# Patient Record
Sex: Female | Born: 1965 | Race: White | Hispanic: No | Marital: Married | Smoking: Never smoker
Health system: Southern US, Community
[De-identification: ages and names within clinical notes are randomized; demographics above are authoritative.]

## PROBLEM LIST (undated history)

## (undated) DIAGNOSIS — J302 Other seasonal allergic rhinitis: Secondary | ICD-10-CM

## (undated) DIAGNOSIS — E785 Hyperlipidemia, unspecified: Secondary | ICD-10-CM

## (undated) DIAGNOSIS — D229 Melanocytic nevi, unspecified: Secondary | ICD-10-CM

## (undated) DIAGNOSIS — Z8619 Personal history of other infectious and parasitic diseases: Secondary | ICD-10-CM

## (undated) DIAGNOSIS — F419 Anxiety disorder, unspecified: Secondary | ICD-10-CM

## (undated) DIAGNOSIS — F32A Depression, unspecified: Secondary | ICD-10-CM

## (undated) DIAGNOSIS — K219 Gastro-esophageal reflux disease without esophagitis: Secondary | ICD-10-CM

## (undated) DIAGNOSIS — L900 Lichen sclerosus et atrophicus: Secondary | ICD-10-CM

## (undated) DIAGNOSIS — N952 Postmenopausal atrophic vaginitis: Secondary | ICD-10-CM

## (undated) DIAGNOSIS — K529 Noninfective gastroenteritis and colitis, unspecified: Secondary | ICD-10-CM

## (undated) HISTORY — PX: ENDOMETRIAL ABLATION: SHX621

## (undated) HISTORY — DX: Depression, unspecified: F32.A

## (undated) HISTORY — PX: TONSILLECTOMY: SHX5217

## (undated) HISTORY — PX: EYE SURGERY: SHX253

## (undated) HISTORY — PX: ABDOMINAL HYSTERECTOMY: SHX81

## (undated) HISTORY — PX: OOPHORECTOMY: SHX86

## (undated) HISTORY — DX: Melanocytic nevi, unspecified: D22.9

## (undated) HISTORY — PX: OTHER SURGICAL HISTORY: SHX169

## (undated) HISTORY — PX: CHOLECYSTECTOMY: SHX55

## (undated) HISTORY — DX: Gastro-esophageal reflux disease without esophagitis: K21.9

## (undated) HISTORY — DX: Postmenopausal atrophic vaginitis: N95.2

## (undated) HISTORY — DX: Anxiety disorder, unspecified: F41.9

## (undated) HISTORY — DX: Lichen sclerosus et atrophicus: L90.0

## (undated) HISTORY — DX: Personal history of other infectious and parasitic diseases: Z86.19

## (undated) HISTORY — DX: Other seasonal allergic rhinitis: J30.2

## (undated) HISTORY — DX: Noninfective gastroenteritis and colitis, unspecified: K52.9

## (undated) HISTORY — DX: Hyperlipidemia, unspecified: E78.5

---

## 1998-08-26 ENCOUNTER — Other Ambulatory Visit: Admission: RE | Admit: 1998-08-26 | Discharge: 1998-08-26 | Payer: Self-pay | Admitting: *Deleted

## 2002-01-23 ENCOUNTER — Other Ambulatory Visit: Admission: RE | Admit: 2002-01-23 | Discharge: 2002-01-23 | Payer: Self-pay | Admitting: *Deleted

## 2002-01-24 ENCOUNTER — Encounter: Admission: RE | Admit: 2002-01-24 | Discharge: 2002-01-24 | Payer: Self-pay | Admitting: *Deleted

## 2002-05-22 ENCOUNTER — Encounter: Admission: RE | Admit: 2002-05-22 | Discharge: 2002-05-22 | Payer: Self-pay | Admitting: *Deleted

## 2002-12-24 ENCOUNTER — Encounter: Admission: RE | Admit: 2002-12-24 | Discharge: 2002-12-24 | Payer: Self-pay | Admitting: Family Medicine

## 2002-12-24 ENCOUNTER — Encounter: Payer: Self-pay | Admitting: Family Medicine

## 2003-06-11 ENCOUNTER — Other Ambulatory Visit: Admission: RE | Admit: 2003-06-11 | Discharge: 2003-06-11 | Payer: Self-pay | Admitting: *Deleted

## 2003-06-13 ENCOUNTER — Encounter: Admission: RE | Admit: 2003-06-13 | Discharge: 2003-06-13 | Payer: Self-pay | Admitting: *Deleted

## 2005-09-14 ENCOUNTER — Ambulatory Visit (HOSPITAL_COMMUNITY): Admission: RE | Admit: 2005-09-14 | Discharge: 2005-09-14 | Payer: Self-pay | Admitting: Obstetrics and Gynecology

## 2006-12-28 ENCOUNTER — Ambulatory Visit (HOSPITAL_COMMUNITY): Admission: RE | Admit: 2006-12-28 | Discharge: 2006-12-28 | Payer: Self-pay | Admitting: Family Medicine

## 2007-04-18 ENCOUNTER — Encounter: Admission: RE | Admit: 2007-04-18 | Discharge: 2007-04-18 | Payer: Self-pay | Admitting: Family Medicine

## 2007-08-29 ENCOUNTER — Ambulatory Visit: Payer: Self-pay

## 2007-10-03 ENCOUNTER — Ambulatory Visit: Payer: Self-pay

## 2007-10-24 ENCOUNTER — Ambulatory Visit: Payer: Self-pay

## 2008-06-02 ENCOUNTER — Emergency Department: Payer: Self-pay | Admitting: Emergency Medicine

## 2009-01-22 ENCOUNTER — Emergency Department: Payer: Self-pay | Admitting: Emergency Medicine

## 2009-01-30 ENCOUNTER — Encounter: Admission: RE | Admit: 2009-01-30 | Discharge: 2009-01-30 | Payer: Self-pay | Admitting: Family Medicine

## 2009-11-11 ENCOUNTER — Ambulatory Visit: Payer: Self-pay | Admitting: Unknown Physician Specialty

## 2009-11-12 ENCOUNTER — Ambulatory Visit: Payer: Self-pay | Admitting: Specialist

## 2009-11-14 ENCOUNTER — Ambulatory Visit: Payer: Self-pay | Admitting: General Practice

## 2009-12-11 ENCOUNTER — Ambulatory Visit: Payer: Self-pay | Admitting: Surgery

## 2013-04-19 ENCOUNTER — Other Ambulatory Visit: Payer: Self-pay | Admitting: Nurse Practitioner

## 2013-04-19 DIAGNOSIS — N6320 Unspecified lump in the left breast, unspecified quadrant: Secondary | ICD-10-CM

## 2013-04-24 ENCOUNTER — Ambulatory Visit
Admission: RE | Admit: 2013-04-24 | Discharge: 2013-04-24 | Disposition: A | Discharge status: Home / Self Care | Payer: BC Managed Care – PPO | Source: Ambulatory Visit | Attending: Family Medicine | Admitting: Family Medicine

## 2013-04-24 ENCOUNTER — Other Ambulatory Visit: Payer: Self-pay | Admitting: Nurse Practitioner

## 2013-04-24 DIAGNOSIS — N6320 Unspecified lump in the left breast, unspecified quadrant: Secondary | ICD-10-CM

## 2013-05-09 ENCOUNTER — Other Ambulatory Visit: Payer: Self-pay

## 2015-01-07 ENCOUNTER — Other Ambulatory Visit: Payer: Self-pay | Admitting: Nurse Practitioner

## 2021-02-17 ENCOUNTER — Other Ambulatory Visit: Payer: Self-pay

## 2021-02-17 ENCOUNTER — Other Ambulatory Visit: Payer: Self-pay | Admitting: Family Medicine

## 2021-02-17 ENCOUNTER — Ambulatory Visit
Admission: RE | Admit: 2021-02-17 | Discharge: 2021-02-17 | Disposition: A | Discharge status: Home / Self Care | Payer: 59 | Source: Ambulatory Visit | Attending: Family Medicine | Admitting: Family Medicine

## 2021-02-17 DIAGNOSIS — Z1231 Encounter for screening mammogram for malignant neoplasm of breast: Secondary | ICD-10-CM

## 2021-08-07 IMAGING — MG MM DIGITAL SCREENING BILAT W/ TOMO AND CAD
8 series · 9 of 24 positions shown · non-contrast
Comparison: Previous exam(s).

CLINICAL DATA: Screening.

EXAM:
DIGITAL SCREENING BILATERAL MAMMOGRAM WITH TOMOSYNTHESIS AND CAD
TECHNIQUE: Bilateral screening digital craniocaudal and mediolateral oblique
mammograms were obtained. Bilateral screening digital breast
tomosynthesis was performed. The images were evaluated with
computer-aided detection.

[R MLO synth-2D]
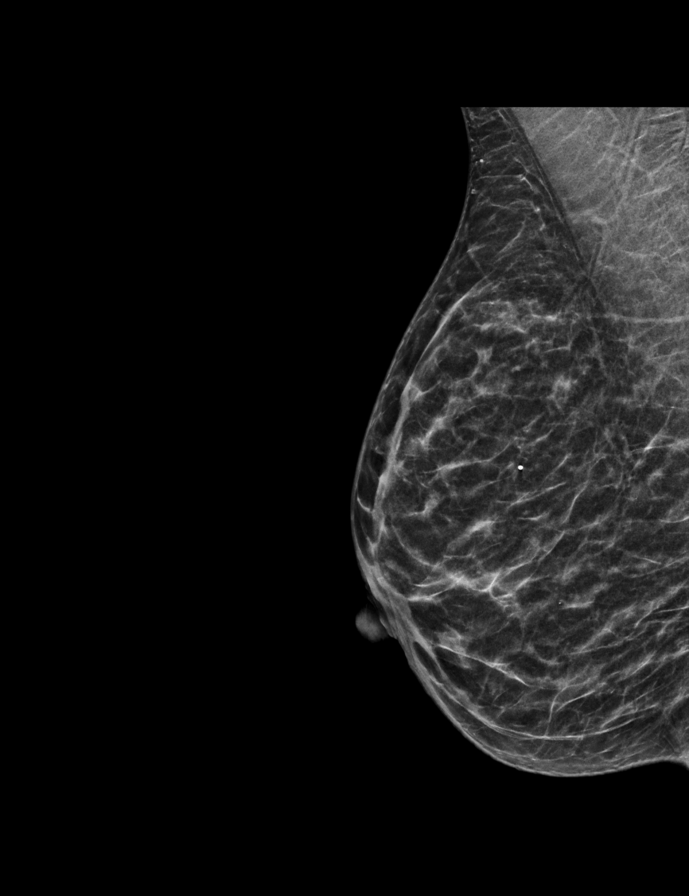

[L MLO synth-2D]
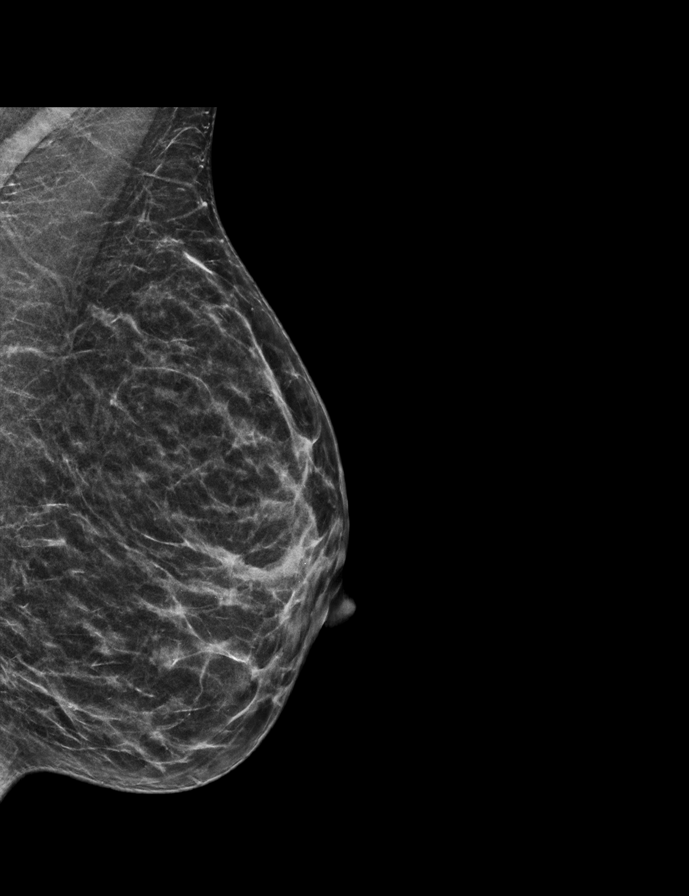

[R CC synth-2D]
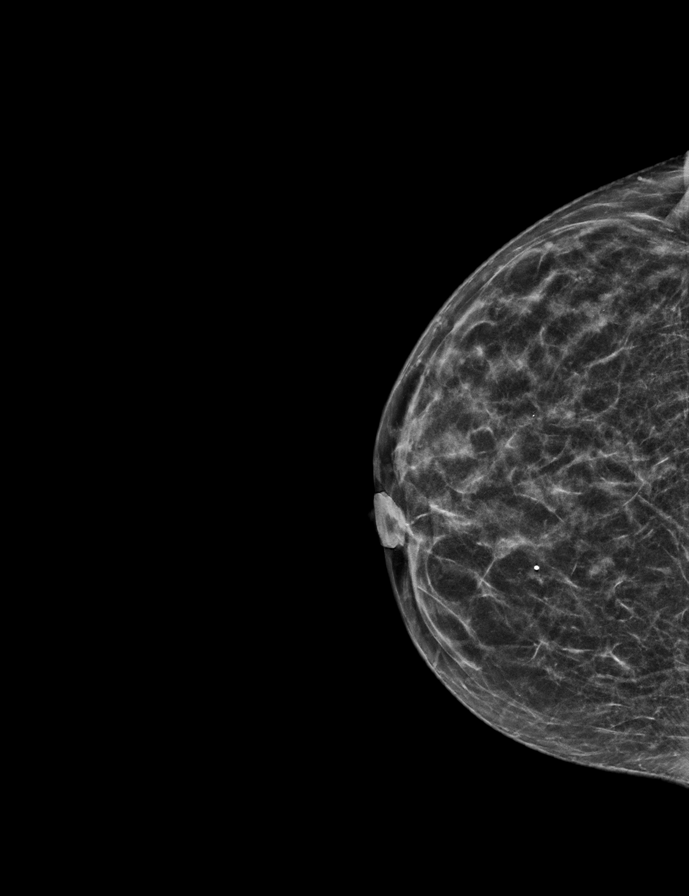

[L CC synth-2D]
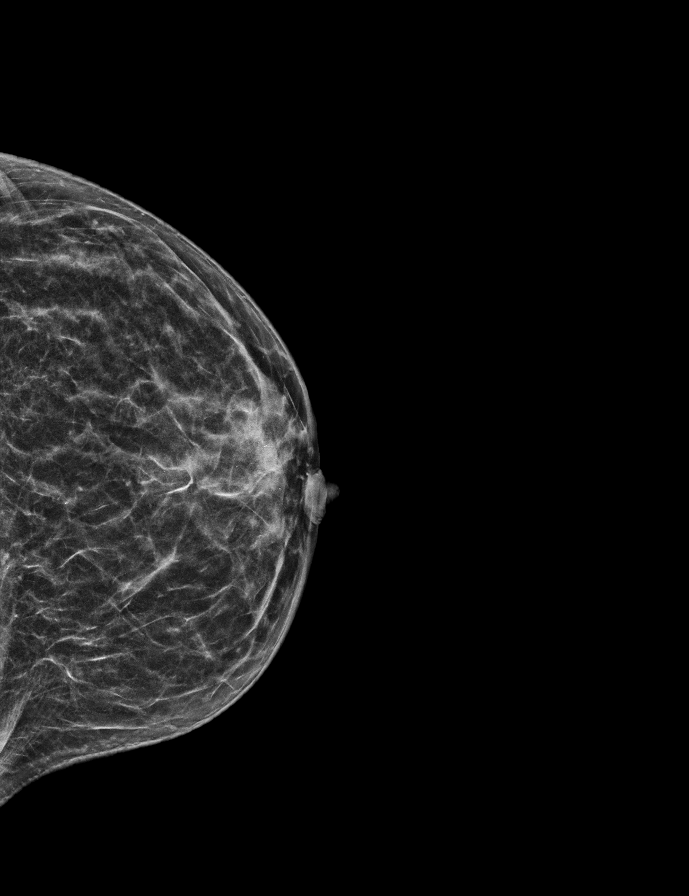

[R CC tomo · 2 of 43 frames shown]
[frame 14/43]
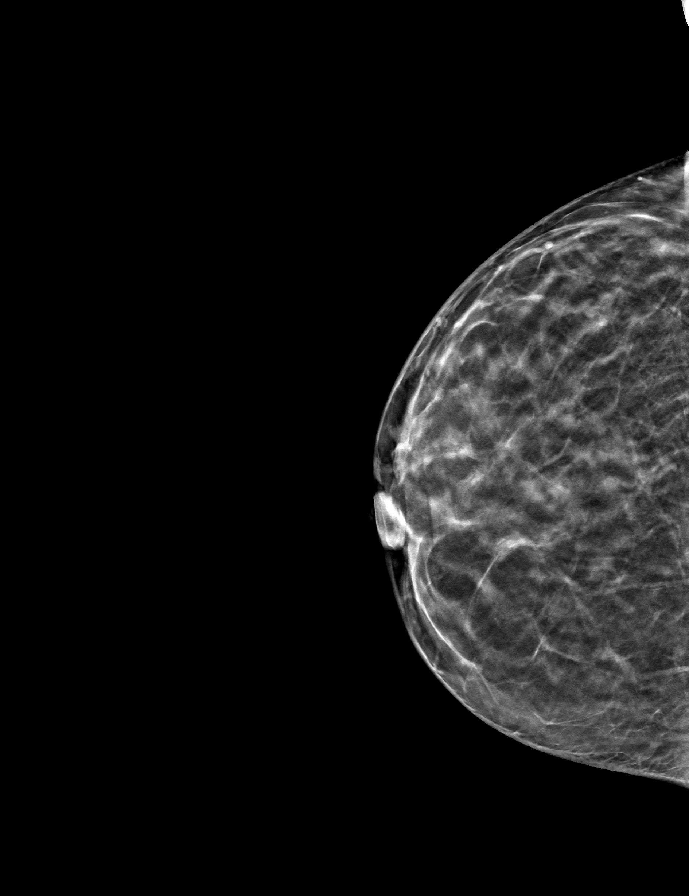
[frame 22/43]
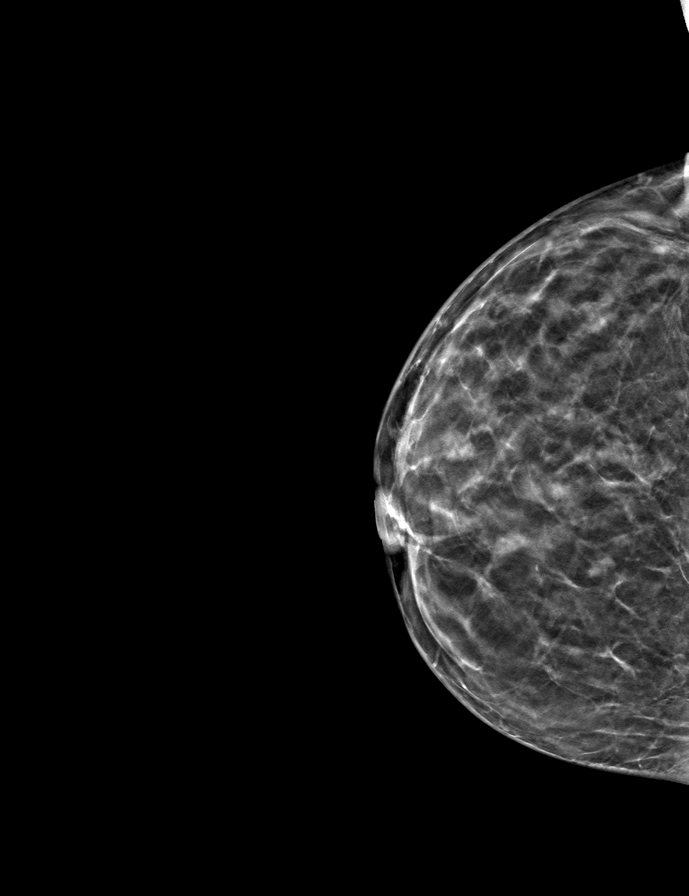

[L MLO tomo · tomo slice 22/43.0]
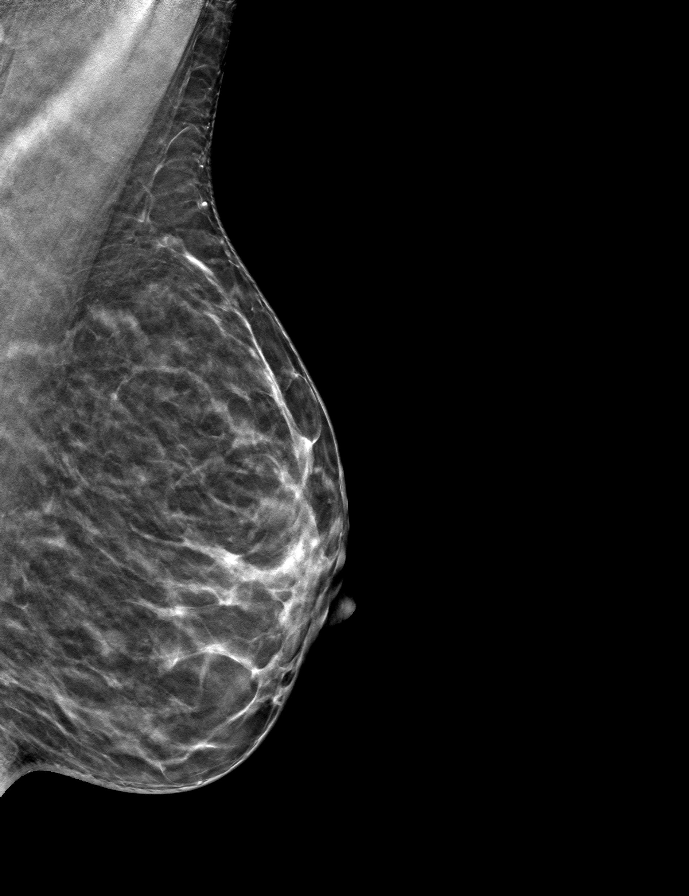

[L CC tomo · tomo slice 23/45.0]
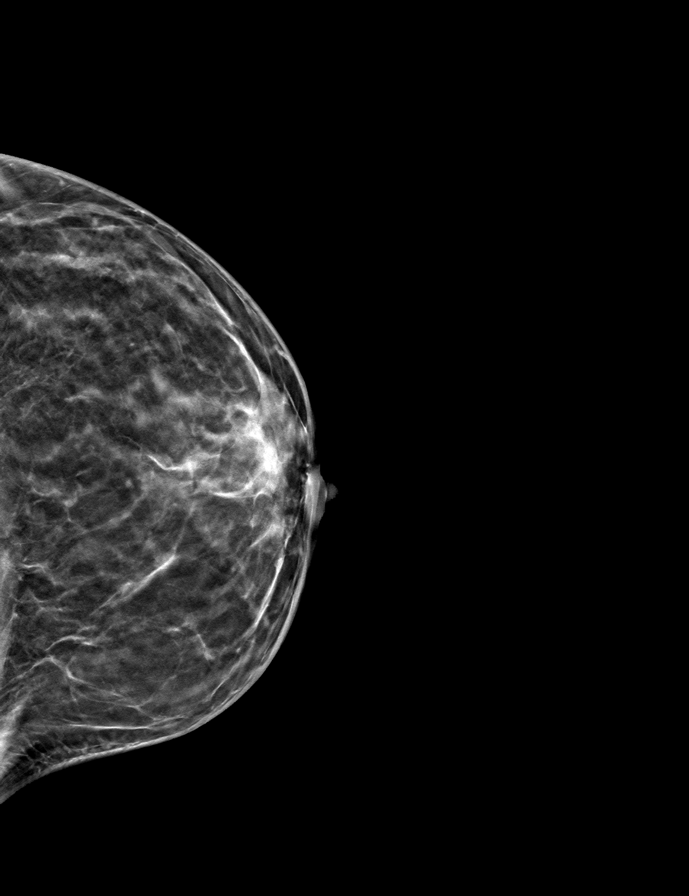

[R MLO tomo · tomo slice 21/40.0]
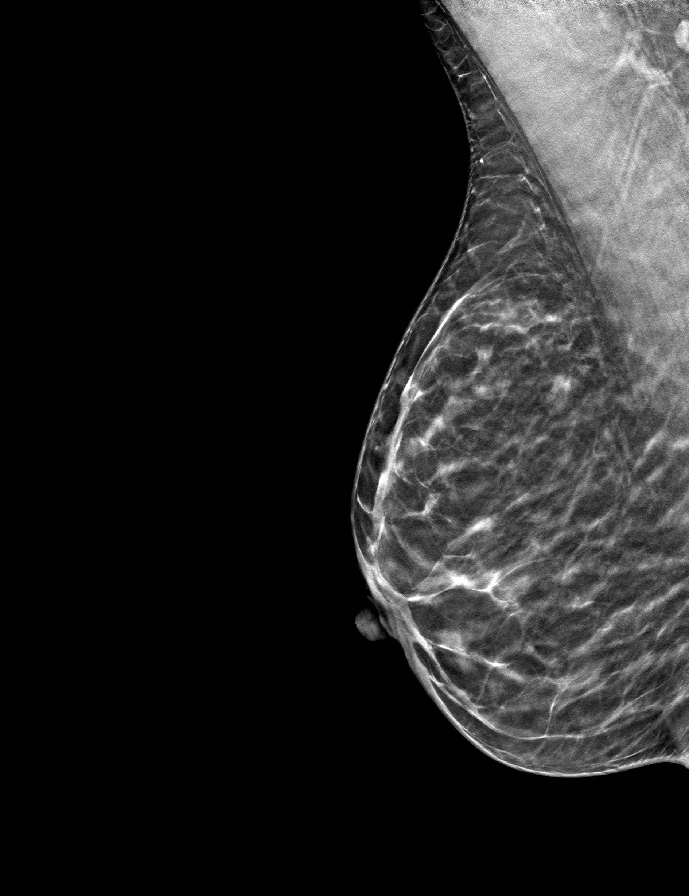

[9 of 24 positions shown; findings below may reference images not displayed]

ACR Breast Density Category b: There are scattered areas of
fibroglandular density.
FINDINGS: There are no findings suspicious for malignancy.
IMPRESSION: No mammographic evidence of malignancy. A result letter of this
screening mammogram will be mailed directly to the patient.

RECOMMENDATION:
Screening mammogram in one year. (Code:51-O-LD2)

BI-RADS CATEGORY  1: Negative.

## 2021-10-01 ENCOUNTER — Telehealth: Payer: Self-pay | Admitting: Dermatology

## 2021-10-01 NOTE — Telephone Encounter (Signed)
I do not have a referral for this patient.

## 2021-10-01 NOTE — Telephone Encounter (Signed)
Please mark 02/24/22 KRS 10:00 appointment as a referral

## 2022-01-15 ENCOUNTER — Other Ambulatory Visit: Payer: Self-pay | Admitting: Family Medicine

## 2022-01-19 ENCOUNTER — Other Ambulatory Visit (HOSPITAL_BASED_OUTPATIENT_CLINIC_OR_DEPARTMENT_OTHER): Payer: Self-pay | Admitting: Family Medicine

## 2022-01-19 DIAGNOSIS — Z1231 Encounter for screening mammogram for malignant neoplasm of breast: Secondary | ICD-10-CM

## 2022-02-19 ENCOUNTER — Encounter (HOSPITAL_BASED_OUTPATIENT_CLINIC_OR_DEPARTMENT_OTHER): Payer: Self-pay | Admitting: Radiology

## 2022-02-19 ENCOUNTER — Ambulatory Visit (HOSPITAL_BASED_OUTPATIENT_CLINIC_OR_DEPARTMENT_OTHER)
Admission: RE | Admit: 2022-02-19 | Discharge: 2022-02-19 | Disposition: A | Discharge status: Home / Self Care | Payer: 59 | Source: Ambulatory Visit | Attending: Family Medicine | Admitting: Family Medicine

## 2022-02-19 DIAGNOSIS — Z1231 Encounter for screening mammogram for malignant neoplasm of breast: Secondary | ICD-10-CM | POA: Diagnosis present

## 2022-02-24 ENCOUNTER — Encounter: Payer: Self-pay | Admitting: Physician Assistant

## 2022-02-24 ENCOUNTER — Ambulatory Visit (INDEPENDENT_AMBULATORY_CARE_PROVIDER_SITE_OTHER): Payer: 59 | Admitting: Physician Assistant

## 2022-02-24 DIAGNOSIS — L900 Lichen sclerosus et atrophicus: Secondary | ICD-10-CM

## 2022-02-24 MED ORDER — TACROLIMUS 0.1 % EX OINT: TOPICAL_OINTMENT | Freq: Every day | CUTANEOUS | 1 refills | Status: AC

## 2022-02-24 NOTE — Patient Instructions (Signed)
Seborrheic Keratosis A seborrheic keratosis is a common, noncancerous (benign) skin growth. These growths are velvety, waxy, rough, tan, brown, or black spots that appear on the skin. These skin growths can be flat or raised, and scaly. What are the causes? The cause of this condition is not known. What increases the risk? You are more likely to develop this condition if you: Have a family history of seborrheic keratosis. Are 50 or older. Are pregnant. Have had estrogen replacement therapy. What are the signs or symptoms? Symptoms of this condition include growths on the face, chest, shoulders, back, or other areas. These growths: Are usually painless, but may become irritated and itchy. Can be yellow, brown, black, or other colors. Are slightly raised or have a flat surface. Are sometimes rough or wart-like in texture. Are often velvety or waxy on the surface. Are round or oval-shaped. Often occur in groups, but may occur as a single growth. How is this diagnosed? This condition is diagnosed with a medical history and physical exam. A sample of the growth may be tested (skin biopsy). You may need to see a skin specialist (dermatologist). How is this treated? Treatment is not usually needed for this condition, unless the growths are irritated or bleed often. You may also choose to have the growths removed if you do not like their appearance. Most commonly, these growths are treated with a procedure in which liquid nitrogen is applied to "freeze" off the growth (cryosurgery). They may also be burned off with electricity (electrocautery) or removed by scraping (curettage). Follow these instructions at home: Watch your growth for any changes. Keep all follow-up visits as told by your health care provider. This is important. Do not scratch or pick at the growth or growths. This can cause them to become irritated or infected. Contact a health care provider if: You suddenly have many new  growths. Your growth bleeds, itches, or hurts. Your growth suddenly becomes larger or changes color. Summary A seborrheic keratosis is a common, noncancerous (benign) skin growth. Treatment is not usually needed for this condition, unless the growths are irritated or bleed often. Watch your growth for any changes. Contact a health care provider if you suddenly have many new growths or your growth suddenly becomes larger or changes color. Keep all follow-up visits as told by your health care provider. This is important. This information is not intended to replace advice given to you by your health care provider. Make sure you discuss any questions you have with your health care provider. Document Revised: 06/03/2021 Document Reviewed: 06/03/2021 Elsevier Patient Education  2023 Elsevier Inc.  

## 2022-02-24 NOTE — Progress Notes (Signed)
   New Patient   Subjective  Michaela Wiley is a 55 y.o. female who presents for the following: New Patient (Initial Visit) (Patient here today for skin check per patient she had a atypical mole removed on her right upper arm by Dr. Juel Burrow office. Per patient she has lichen sclerosis x 20 years. Patient states that she does use Clobetasol for current treatment. She is seeing a urogynecologist to determine if she has scarring is located in urethra. Family history of non mole skin cancer. ).   The following portions of the chart were reviewed this encounter and updated as appropriate:  Tobacco  Allergies  Meds  Problems  Med Hx  Surg Hx  Fam Hx      Objective  Well appearing patient in no apparent distress; mood and affect are within normal limits.  A full examination was performed including scalp, head, eyes, ears, nose, lips, neck, chest, axillae, abdomen, back, buttocks, bilateral upper extremities, bilateral lower extremities, hands, feet, fingers, toes, fingernails, and toenails. All findings within normal limits unless otherwise noted below.  Left Labium Majus, Left Labium Minus, Right Labium Majus, Right Labium Minus White discoloration and scarring. Incidental hemangioma's also scattered.   Assessment & Plan  Lichen sclerosus (4) Left Labium Majus; Right Labium Majus; Left Labium Minus; Right Labium Minus  Use Protopic daily and Clobetasol twice a week. If tolerable, wean off of clobetasol and use for flares only.   tacrolimus (PROTOPIC) 0.1 % ointment - Left Labium Majus, Left Labium Minus, Right Labium Majus, Right Labium Minus Apply topically at bedtime.     I, Alizeh Madril, PA-C, have reviewed all documentation's for this visit.  The documentation on 02/24/22 for the exam, diagnosis, procedures and orders are all accurate and complete.

## 2022-03-04 ENCOUNTER — Telehealth: Payer: Self-pay | Admitting: Physician Assistant

## 2022-03-04 NOTE — Telephone Encounter (Signed)
Patient left message on office voice mail at 1:00 PM that her insurance is not covering the prescription for Clobetsol and she has had to wait for 5 days for the prescription to be sent in in the first place.  Patient wants to know what need to be done to get the prescription.

## 2022-03-08 NOTE — Telephone Encounter (Signed)
Phone call to patient regarding her insurance company not covering her Clobetasol. Per patient it's not the Clobetasol it's the Protopic that her insurance company will not cover. Patient states that her pharmacy informed her that we will need to do a prior authorization for the medication. I informed patient I would get the prior authorization completed.

## 2022-03-08 NOTE — Telephone Encounter (Signed)
Prior authorization done for Protopic through Cover My Meds.     Your information has been sent to [employer].

## 2022-06-15 ENCOUNTER — Ambulatory Visit: Payer: Self-pay | Admitting: Obstetrics and Gynecology

## 2022-09-03 DIAGNOSIS — L280 Lichen simplex chronicus: Secondary | ICD-10-CM | POA: Diagnosis not present

## 2022-09-03 DIAGNOSIS — D2272 Melanocytic nevi of left lower limb, including hip: Secondary | ICD-10-CM | POA: Diagnosis not present

## 2022-09-03 DIAGNOSIS — D225 Melanocytic nevi of trunk: Secondary | ICD-10-CM | POA: Diagnosis not present

## 2022-09-03 DIAGNOSIS — R208 Other disturbances of skin sensation: Secondary | ICD-10-CM | POA: Diagnosis not present

## 2022-09-03 DIAGNOSIS — D2271 Melanocytic nevi of right lower limb, including hip: Secondary | ICD-10-CM | POA: Diagnosis not present

## 2022-09-03 DIAGNOSIS — Z1283 Encounter for screening for malignant neoplasm of skin: Secondary | ICD-10-CM | POA: Diagnosis not present

## 2022-09-03 DIAGNOSIS — D485 Neoplasm of uncertain behavior of skin: Secondary | ICD-10-CM | POA: Diagnosis not present

## 2023-01-07 DIAGNOSIS — Z0000 Encounter for general adult medical examination without abnormal findings: Secondary | ICD-10-CM | POA: Diagnosis not present

## 2023-01-07 DIAGNOSIS — Z13220 Encounter for screening for lipoid disorders: Secondary | ICD-10-CM | POA: Diagnosis not present

## 2023-03-04 DIAGNOSIS — Z1283 Encounter for screening for malignant neoplasm of skin: Secondary | ICD-10-CM | POA: Diagnosis not present

## 2023-03-04 DIAGNOSIS — D225 Melanocytic nevi of trunk: Secondary | ICD-10-CM | POA: Diagnosis not present

## 2023-03-18 DIAGNOSIS — Z23 Encounter for immunization: Secondary | ICD-10-CM | POA: Diagnosis not present

## 2023-03-22 DIAGNOSIS — F411 Generalized anxiety disorder: Secondary | ICD-10-CM | POA: Diagnosis not present

## 2023-03-22 DIAGNOSIS — F32A Depression, unspecified: Secondary | ICD-10-CM | POA: Diagnosis not present

## 2023-03-29 DIAGNOSIS — F32A Depression, unspecified: Secondary | ICD-10-CM | POA: Diagnosis not present

## 2023-03-29 DIAGNOSIS — F411 Generalized anxiety disorder: Secondary | ICD-10-CM | POA: Diagnosis not present

## 2023-04-06 ENCOUNTER — Other Ambulatory Visit (HOSPITAL_BASED_OUTPATIENT_CLINIC_OR_DEPARTMENT_OTHER): Payer: Self-pay | Admitting: Family Medicine

## 2023-04-06 DIAGNOSIS — Z1231 Encounter for screening mammogram for malignant neoplasm of breast: Secondary | ICD-10-CM

## 2023-04-07 ENCOUNTER — Ambulatory Visit (HOSPITAL_BASED_OUTPATIENT_CLINIC_OR_DEPARTMENT_OTHER)
Admission: RE | Admit: 2023-04-07 | Discharge: 2023-04-07 | Disposition: A | Discharge status: Home / Self Care | Payer: BC Managed Care – PPO | Source: Ambulatory Visit | Attending: Family Medicine | Admitting: Family Medicine

## 2023-04-07 DIAGNOSIS — Z1231 Encounter for screening mammogram for malignant neoplasm of breast: Secondary | ICD-10-CM | POA: Diagnosis not present

## 2023-04-13 DIAGNOSIS — N3281 Overactive bladder: Secondary | ICD-10-CM | POA: Diagnosis not present

## 2023-04-13 DIAGNOSIS — R350 Frequency of micturition: Secondary | ICD-10-CM | POA: Diagnosis not present

## 2023-06-17 DIAGNOSIS — F411 Generalized anxiety disorder: Secondary | ICD-10-CM | POA: Diagnosis not present

## 2023-06-17 DIAGNOSIS — F32A Depression, unspecified: Secondary | ICD-10-CM | POA: Diagnosis not present

## 2023-06-30 DIAGNOSIS — F32A Depression, unspecified: Secondary | ICD-10-CM | POA: Diagnosis not present

## 2023-06-30 DIAGNOSIS — F411 Generalized anxiety disorder: Secondary | ICD-10-CM | POA: Diagnosis not present

## 2023-07-28 DIAGNOSIS — F411 Generalized anxiety disorder: Secondary | ICD-10-CM | POA: Diagnosis not present

## 2023-07-28 DIAGNOSIS — F32A Depression, unspecified: Secondary | ICD-10-CM | POA: Diagnosis not present

## 2023-08-08 DIAGNOSIS — D225 Melanocytic nevi of trunk: Secondary | ICD-10-CM | POA: Diagnosis not present

## 2023-08-08 DIAGNOSIS — Z1283 Encounter for screening for malignant neoplasm of skin: Secondary | ICD-10-CM | POA: Diagnosis not present

## 2023-08-08 DIAGNOSIS — L900 Lichen sclerosus et atrophicus: Secondary | ICD-10-CM | POA: Diagnosis not present

## 2023-08-08 DIAGNOSIS — L280 Lichen simplex chronicus: Secondary | ICD-10-CM | POA: Diagnosis not present

## 2023-10-28 DIAGNOSIS — F331 Major depressive disorder, recurrent, moderate: Secondary | ICD-10-CM | POA: Diagnosis not present

## 2023-10-28 DIAGNOSIS — F4312 Post-traumatic stress disorder, chronic: Secondary | ICD-10-CM | POA: Diagnosis not present

## 2023-11-09 DIAGNOSIS — F331 Major depressive disorder, recurrent, moderate: Secondary | ICD-10-CM | POA: Diagnosis not present

## 2023-11-09 DIAGNOSIS — F4312 Post-traumatic stress disorder, chronic: Secondary | ICD-10-CM | POA: Diagnosis not present

## 2023-11-14 DIAGNOSIS — J0100 Acute maxillary sinusitis, unspecified: Secondary | ICD-10-CM | POA: Diagnosis not present

## 2023-11-23 DIAGNOSIS — F4312 Post-traumatic stress disorder, chronic: Secondary | ICD-10-CM | POA: Diagnosis not present

## 2023-11-23 DIAGNOSIS — F331 Major depressive disorder, recurrent, moderate: Secondary | ICD-10-CM | POA: Diagnosis not present

## 2023-12-07 DIAGNOSIS — F4312 Post-traumatic stress disorder, chronic: Secondary | ICD-10-CM | POA: Diagnosis not present

## 2023-12-07 DIAGNOSIS — F331 Major depressive disorder, recurrent, moderate: Secondary | ICD-10-CM | POA: Diagnosis not present

## 2023-12-21 DIAGNOSIS — F331 Major depressive disorder, recurrent, moderate: Secondary | ICD-10-CM | POA: Diagnosis not present

## 2023-12-21 DIAGNOSIS — F4312 Post-traumatic stress disorder, chronic: Secondary | ICD-10-CM | POA: Diagnosis not present

## 2023-12-30 DIAGNOSIS — S91331A Puncture wound without foreign body, right foot, initial encounter: Secondary | ICD-10-CM | POA: Diagnosis not present

## 2024-01-04 DIAGNOSIS — F331 Major depressive disorder, recurrent, moderate: Secondary | ICD-10-CM | POA: Diagnosis not present

## 2024-01-04 DIAGNOSIS — F4312 Post-traumatic stress disorder, chronic: Secondary | ICD-10-CM | POA: Diagnosis not present

## 2024-01-13 DIAGNOSIS — E785 Hyperlipidemia, unspecified: Secondary | ICD-10-CM | POA: Diagnosis not present

## 2024-01-13 DIAGNOSIS — Z0000 Encounter for general adult medical examination without abnormal findings: Secondary | ICD-10-CM | POA: Diagnosis not present

## 2024-01-13 DIAGNOSIS — K219 Gastro-esophageal reflux disease without esophagitis: Secondary | ICD-10-CM | POA: Diagnosis not present

## 2024-01-13 DIAGNOSIS — N952 Postmenopausal atrophic vaginitis: Secondary | ICD-10-CM | POA: Diagnosis not present

## 2024-01-13 DIAGNOSIS — L900 Lichen sclerosus et atrophicus: Secondary | ICD-10-CM | POA: Diagnosis not present

## 2024-01-17 ENCOUNTER — Other Ambulatory Visit (HOSPITAL_BASED_OUTPATIENT_CLINIC_OR_DEPARTMENT_OTHER): Payer: Self-pay | Admitting: Family Medicine

## 2024-01-17 DIAGNOSIS — E785 Hyperlipidemia, unspecified: Secondary | ICD-10-CM

## 2024-01-25 DIAGNOSIS — F4312 Post-traumatic stress disorder, chronic: Secondary | ICD-10-CM | POA: Diagnosis not present

## 2024-01-25 DIAGNOSIS — F331 Major depressive disorder, recurrent, moderate: Secondary | ICD-10-CM | POA: Diagnosis not present

## 2024-02-02 ENCOUNTER — Ambulatory Visit (HOSPITAL_COMMUNITY)
Admission: RE | Admit: 2024-02-02 | Discharge: 2024-02-02 | Disposition: A | Discharge status: Home / Self Care | Payer: Self-pay | Source: Ambulatory Visit | Attending: Family Medicine | Admitting: Family Medicine

## 2024-02-02 DIAGNOSIS — E785 Hyperlipidemia, unspecified: Secondary | ICD-10-CM | POA: Insufficient documentation

## 2024-02-10 DIAGNOSIS — F331 Major depressive disorder, recurrent, moderate: Secondary | ICD-10-CM | POA: Diagnosis not present

## 2024-02-10 DIAGNOSIS — F4312 Post-traumatic stress disorder, chronic: Secondary | ICD-10-CM | POA: Diagnosis not present

## 2024-02-16 DIAGNOSIS — M722 Plantar fascial fibromatosis: Secondary | ICD-10-CM | POA: Diagnosis not present

## 2024-02-16 DIAGNOSIS — M2011 Hallux valgus (acquired), right foot: Secondary | ICD-10-CM | POA: Diagnosis not present

## 2024-02-16 DIAGNOSIS — M2012 Hallux valgus (acquired), left foot: Secondary | ICD-10-CM | POA: Diagnosis not present

## 2024-02-22 DIAGNOSIS — E789 Disorder of lipoprotein metabolism, unspecified: Secondary | ICD-10-CM | POA: Diagnosis not present

## 2024-02-22 DIAGNOSIS — R931 Abnormal findings on diagnostic imaging of heart and coronary circulation: Secondary | ICD-10-CM | POA: Diagnosis not present

## 2024-02-27 DIAGNOSIS — F331 Major depressive disorder, recurrent, moderate: Secondary | ICD-10-CM | POA: Diagnosis not present

## 2024-02-27 DIAGNOSIS — F4312 Post-traumatic stress disorder, chronic: Secondary | ICD-10-CM | POA: Diagnosis not present

## 2024-03-13 ENCOUNTER — Other Ambulatory Visit: Payer: Self-pay | Admitting: Family Medicine

## 2024-03-13 DIAGNOSIS — Z1231 Encounter for screening mammogram for malignant neoplasm of breast: Secondary | ICD-10-CM

## 2024-03-21 DIAGNOSIS — F331 Major depressive disorder, recurrent, moderate: Secondary | ICD-10-CM | POA: Diagnosis not present

## 2024-03-21 DIAGNOSIS — F4312 Post-traumatic stress disorder, chronic: Secondary | ICD-10-CM | POA: Diagnosis not present

## 2024-04-04 ENCOUNTER — Other Ambulatory Visit: Payer: Self-pay | Admitting: Medical Genetics

## 2024-04-06 ENCOUNTER — Encounter

## 2024-04-06 DIAGNOSIS — Z1231 Encounter for screening mammogram for malignant neoplasm of breast: Secondary | ICD-10-CM

## 2024-04-11 DIAGNOSIS — F331 Major depressive disorder, recurrent, moderate: Secondary | ICD-10-CM | POA: Diagnosis not present

## 2024-04-11 DIAGNOSIS — F4312 Post-traumatic stress disorder, chronic: Secondary | ICD-10-CM | POA: Diagnosis not present

## 2024-04-16 ENCOUNTER — Encounter

## 2024-04-16 DIAGNOSIS — Z1231 Encounter for screening mammogram for malignant neoplasm of breast: Secondary | ICD-10-CM

## 2024-04-17 ENCOUNTER — Other Ambulatory Visit

## 2024-04-17 DIAGNOSIS — Z006 Encounter for examination for normal comparison and control in clinical research program: Secondary | ICD-10-CM

## 2024-04-18 ENCOUNTER — Ambulatory Visit
Admission: RE | Admit: 2024-04-18 | Discharge: 2024-04-18 | Disposition: A | Discharge status: Home / Self Care | Source: Ambulatory Visit | Attending: Family Medicine | Admitting: Family Medicine

## 2024-04-18 DIAGNOSIS — Z1231 Encounter for screening mammogram for malignant neoplasm of breast: Secondary | ICD-10-CM | POA: Diagnosis not present

## 2024-04-19 ENCOUNTER — Emergency Department (HOSPITAL_COMMUNITY)

## 2024-04-19 ENCOUNTER — Other Ambulatory Visit: Payer: Self-pay

## 2024-04-19 ENCOUNTER — Encounter (HOSPITAL_COMMUNITY): Payer: Self-pay | Admitting: Emergency Medicine

## 2024-04-19 ENCOUNTER — Emergency Department (HOSPITAL_COMMUNITY)
Admission: EM | Admit: 2024-04-19 | Discharge: 2024-04-19 | Disposition: A | Discharge status: Home / Self Care | Attending: Emergency Medicine | Admitting: Emergency Medicine

## 2024-04-19 DIAGNOSIS — Z9049 Acquired absence of other specified parts of digestive tract: Secondary | ICD-10-CM | POA: Diagnosis not present

## 2024-04-19 DIAGNOSIS — Z90710 Acquired absence of both cervix and uterus: Secondary | ICD-10-CM | POA: Diagnosis not present

## 2024-04-19 DIAGNOSIS — R079 Chest pain, unspecified: Secondary | ICD-10-CM

## 2024-04-19 DIAGNOSIS — R1013 Epigastric pain: Secondary | ICD-10-CM | POA: Insufficient documentation

## 2024-04-19 DIAGNOSIS — R0789 Other chest pain: Secondary | ICD-10-CM | POA: Diagnosis not present

## 2024-04-19 DIAGNOSIS — R1010 Upper abdominal pain, unspecified: Secondary | ICD-10-CM | POA: Diagnosis not present

## 2024-04-19 LAB — CBC
HCT: 42.6 % (ref 36.0–46.0)
Hemoglobin: 14.5 g/dL (ref 12.0–15.0)
MCH: 31.3 pg (ref 26.0–34.0)
MCHC: 34 g/dL (ref 30.0–36.0)
MCV: 92 fL (ref 80.0–100.0)
Platelets: 178 K/uL (ref 150–400)
RBC: 4.63 MIL/uL (ref 3.87–5.11)
RDW: 11.7 % (ref 11.5–15.5)
WBC: 8.7 K/uL (ref 4.0–10.5)
nRBC: 0 % (ref 0.0–0.2)

## 2024-04-19 LAB — BASIC METABOLIC PANEL WITH GFR
Anion gap: 14 (ref 5–15)
BUN: 13 mg/dL (ref 6–20)
CO2: 22 mmol/L (ref 22–32)
Calcium: 9.2 mg/dL (ref 8.9–10.3)
Chloride: 108 mmol/L (ref 98–111)
Creatinine, Ser: 0.69 mg/dL (ref 0.44–1.00)
GFR, Estimated: >60 mL/min (ref >60)
Glucose, Bld: 87 mg/dL (ref 70–99)
Potassium: 3.3 mmol/L — ABNORMAL LOW (ref 3.5–5.1)
Sodium: 143 mmol/L (ref 135–145)

## 2024-04-19 LAB — TROPONIN T, HIGH SENSITIVITY
Troponin T High Sensitivity: <15 ng/L (ref 0–19)
Troponin T High Sensitivity: <15 ng/L (ref 0–19)

## 2024-04-19 LAB — HEPATIC FUNCTION PANEL
ALT: 295 U/L — ABNORMAL HIGH (ref 0–44)
AST: 497 U/L — ABNORMAL HIGH (ref 15–41)
Albumin: 3.9 g/dL (ref 3.5–5.0)
Alkaline Phosphatase: 104 U/L (ref 38–126)
Bilirubin, Direct: 0.6 mg/dL — ABNORMAL HIGH (ref 0.0–0.2)
Indirect Bilirubin: 0.6 mg/dL (ref 0.3–0.9)
Total Bilirubin: 1.2 mg/dL (ref 0.0–1.2)
Total Protein: 5.5 g/dL — ABNORMAL LOW (ref 6.5–8.1)

## 2024-04-19 LAB — URINALYSIS, ROUTINE W REFLEX MICROSCOPIC
Bilirubin Urine: NEGATIVE
Glucose, UA: NEGATIVE mg/dL
Hgb urine dipstick: NEGATIVE
Ketones, ur: 5 mg/dL — AB
Leukocytes,Ua: NEGATIVE
Nitrite: NEGATIVE
Protein, ur: NEGATIVE mg/dL
Specific Gravity, Urine: 1.013 (ref 1.005–1.030)
pH: 7 (ref 5.0–8.0)

## 2024-04-19 LAB — LIPASE, BLOOD: Lipase: 67 U/L — ABNORMAL HIGH (ref 11–51)

## 2024-04-19 MED ORDER — ONDANSETRON 4 MG PO TBDP: ORAL_TABLET | ORAL | 0 refills | Status: AC

## 2024-04-19 MED ORDER — PANTOPRAZOLE SODIUM 40 MG IV SOLR
40.0000 mg | Freq: Once | INTRAVENOUS | Status: AC
  Administered 2024-04-19: 40 mg via INTRAVENOUS
  Filled 2024-04-19: qty 10

## 2024-04-19 MED ORDER — ONDANSETRON HCL 4 MG/2ML IJ SOLN
4.0000 mg | Freq: Once | INTRAMUSCULAR | Status: AC
  Administered 2024-04-19: 4 mg via INTRAVENOUS
  Filled 2024-04-19: qty 2

## 2024-04-19 MED ORDER — HYDROMORPHONE HCL 1 MG/ML IJ SOLN
1.0000 mg | Freq: Once | INTRAMUSCULAR | Status: AC
  Administered 2024-04-19: 1 mg via INTRAVENOUS
  Filled 2024-04-19: qty 1

## 2024-04-19 MED ORDER — IOHEXOL 300 MG/ML  SOLN
100.0000 mL | Freq: Once | INTRAMUSCULAR | Status: AC | PRN
  Administered 2024-04-19: 100 mL via INTRAVENOUS

## 2024-04-19 MED ORDER — SODIUM CHLORIDE 0.9 % IV BOLUS
1000.0000 mL | Freq: Once | INTRAVENOUS | Status: AC
  Administered 2024-04-19: 1000 mL via INTRAVENOUS

## 2024-04-19 NOTE — ED Provider Notes (Signed)
  EMERGENCY DEPARTMENT AT [facility] Provider Note   CSN: [id number] Arrival date & time: 04/19/24  1420     Patient presents with: Chest Pain   Michaela Wiley is a 58 y.o. female.  She is complaining of severe epigastric pain radiating to into her chest and back that started about 20 minutes after eating cream of wheat and blueberries.  She said she ate [place] last night and had some alcohol and had a somewhat upset stomach but that seemed to improve this morning.  Pain became severe and she tried vomiting with some improvement.  Pain recurred and has been trying to vomit again to relieve it.  Now just dry heaving.  No similar symptoms in the past.  No history of any cardiac disease.  No cocaine.  Has had a history of a cholecystectomy in the past.  No fevers or chills.   The history is provided by the patient.  Abdominal Pain Pain location: subxiphoid. Pain radiates to:  Chest and back Pain severity:  Severe Onset quality:  Sudden Duration:  2 hours Timing:  Intermittent Progression:  Unchanged Chronicity:  New Relieved by:  Vomiting Worsened by:  Nothing Ineffective treatments:  Vomiting Associated symptoms: chest pain, nausea and vomiting   Associated symptoms: no cough, no diarrhea, no dysuria, no fever, no hematemesis and no shortness of breath        Prior to Admission medications   Medication Sig Start Date End Date Taking? Authorizing Provider  atorvastatin (LIPITOR) 10 MG tablet Take 1 tablet by mouth daily.    [provider]  ibuprofen (ADVIL) 200 MG tablet Take by mouth.    [provider]  Omega-3 Fatty Acids (RA FISH OIL) 1000 MG CAPS Take by mouth.    [provider]  St Johns Wort 1000 MG CAPS 2 tablets    [provider]  tacrolimus  (PROTOPIC ) 0.1 % ointment Apply topically at bedtime. 02/24/22   Sheffield, Kelli R, PA-C    Allergies: Phenazopyridine and Pseudoephedrine hcl    Review of Systems   Constitutional:  Negative for fever.  Respiratory:  Negative for cough and shortness of breath.   Cardiovascular:  Positive for chest pain.  Gastrointestinal:  Positive for abdominal pain, nausea and vomiting. Negative for diarrhea and hematemesis.  Genitourinary:  Negative for dysuria.    Updated Vital Signs BP (!) 138/92 (BP Location: Right Arm)   Pulse 86   Temp 98.2 F (36.8 C) (Oral)   Resp 18   Ht 5' 4 (1.626 m)   Wt 66.7 kg   SpO2 100%   BMI 25.23 kg/m   Physical Exam Vitals and nursing note reviewed.  Constitutional:      General: She is in acute distress.     Appearance: Normal appearance. She is well-developed.  HENT:     Head: Normocephalic and atraumatic.  Eyes:     Conjunctiva/sclera: Conjunctivae normal.  Cardiovascular:     Rate and Rhythm: Normal rate and regular rhythm.     Heart sounds: No murmur heard. Pulmonary:     Effort: Pulmonary effort is normal. No respiratory distress.     Breath sounds: Normal breath sounds. No stridor. No wheezing.  Abdominal:     Palpations: Abdomen is soft.     Tenderness: There is abdominal tenderness (upper abd). There is no guarding or rebound.  Musculoskeletal:        General: No deformity.     Cervical back: Neck supple.  Right lower leg: No edema.     Left lower leg: No edema.  Skin:    General: Skin is warm and dry.  Neurological:     General: No focal deficit present.     Mental Status: She is alert.     GCS: GCS eye subscore is 4. GCS verbal subscore is 5. GCS motor subscore is 6.     (all labs ordered are listed, but only abnormal results are displayed) Labs Reviewed  BASIC METABOLIC PANEL WITH GFR - Abnormal; Notable for the following components:      Result Value   Potassium 3.3 (*)    All other components within normal limits  HEPATIC FUNCTION PANEL - Abnormal; Notable for the following components:   Total Protein 5.5 (*)    AST 497 (*)    ALT 295 (*)    Bilirubin, Direct 0.6 (*)    All  other components within normal limits  LIPASE, BLOOD - Abnormal; Notable for the following components:   Lipase 67 (*)    All other components within normal limits  CBC  URINALYSIS, ROUTINE W REFLEX MICROSCOPIC  TROPONIN T, HIGH SENSITIVITY  TROPONIN T, HIGH SENSITIVITY    EKG: EKG Interpretation Date/Time:  Thursday April 19 2024 14:32:32 EDT Ventricular Rate:  92 PR Interval:  136 QRS Duration:  88 QT Interval:  351 QTC Calculation: 435 R Axis:   86  Text Interpretation: Sinus rhythm Biatrial enlargement COPY Confirmed by Towana Sharper [phone]) on 04/19/2024 2:48:47 PM  Radiology: [facility] Chest Port 1 View Result Date: 04/19/2024 CLINICAL DATA:  Chest pain. EXAM: PORTABLE CHEST 1 VIEW COMPARISON:  None Available. FINDINGS: No focal consolidation, pleural effusion, or pneumothorax. The cardiac silhouette is within normal limits. No acute osseous pathology. IMPRESSION: No active disease. Electronically Signed   By: Vanetta Chou M.D.   On: 04/19/2024 16:05     Procedures   Medications Ordered in the ED  HYDROmorphone  (DILAUDID ) injection 1 mg (1 mg Intravenous Given 04/19/24 1510)  ondansetron  (ZOFRAN ) injection 4 mg (4 mg Intravenous Given 04/19/24 1511)  sodium chloride  0.9 % bolus 1,000 mL (0 mLs Intravenous Stopped 04/19/24 1624)  pantoprazole  (PROTONIX ) injection 40 mg (40 mg Intravenous Given 04/19/24 1511)    Clinical Course as of 04/19/24 1827  Thu Apr 19, 2024  1520 Chest x-ray interpreted by me as no acute infiltrate.  Awaiting radiology reading. [MB]    Clinical Course User Index [MB] Towana Sharper BROCKS, MD                                 Medical Decision Making Amount and/or Complexity of Data Reviewed Labs: ordered. Radiology: ordered.  Risk Prescription drug management.   This patient complains of upper abdominal pain into chest; this involves an extensive number of treatment Options and is a complaint that carries with it a high risk of complications  and morbidity. The differential includes biliary colic, retained stone, gastritis, pancreatitis, perforation, ACS, vascular  I ordered, reviewed and interpreted labs, which included CBC normal, chemistries with mildly low potassium, troponin flat I ordered medication IV pain medicine and nausea medication and reviewed PMP when indicated. I ordered imaging studies which included chest x-ray and I independently    visualized and interpreted imaging which showed no acute findings Previous records obtained and reviewed in epic no recent admissions Cardiac monitoring reviewed, normal sinus rhythm Social determinants considered, no significant barriers Critical Interventions:  None  After the interventions stated above, I reevaluated the patient and found still to be uncomfortable but improved Admission and further testing considered, her care is signed out to Dr. Emil.  Plan is to follow-up on lab work for abdomen including LFTs and lipase.  Will likely need some further imaging.  Disposition per results of testing.      Final diagnoses:  None    ED Discharge Orders     None          Towana Ozell BROCKS, MD 04/19/24 [id number]

## 2024-04-19 NOTE — ED Triage Notes (Signed)
 Patient report chest pain started this morning. Patient report worsening chest pain and upper quadrant abdominal pain after eating blueberry. Patient report she felt better after trying to make her self threw up but patient pain keeps coming back.

## 2024-04-19 NOTE — ED Provider Notes (Signed)
 Received patient in turnover from Dr. Towana.  Please see their note for further details of Hx, PE.  Briefly patient is a 58 y.o. female with a Chest Pain .  Plan for imaging post labs.  Significant epigastric pain after eating.  LFTs elevated.  Will obtain CT imaging.  CT without obvious acute pathology.  I discussed case with Dr. Elicia, gastroenterology.  The patient was feeling better reasonable to discharge home with follow-up with GI clinic.      Emil Share, DO 04/19/24 2111

## 2024-04-19 NOTE — Discharge Instructions (Addendum)
 Try pepcid  or tagamet up to twice a day.  Try to avoid things that may make this worse, most commonly these are spicy foods tomato based products fatty foods chocolate and peppermint.  Alcohol and tobacco can also make this worse.  Return to the emergency department for sudden worsening pain fever or inability to eat or drink.

## 2024-04-27 LAB — GENECONNECT MOLECULAR SCREEN: Genetic Analysis Overall Interpretation: NEGATIVE

## 2024-04-30 DIAGNOSIS — E785 Hyperlipidemia, unspecified: Secondary | ICD-10-CM | POA: Diagnosis not present

## 2024-04-30 DIAGNOSIS — R7401 Elevation of levels of liver transaminase levels: Secondary | ICD-10-CM | POA: Diagnosis not present

## 2024-05-02 DIAGNOSIS — R7989 Other specified abnormal findings of blood chemistry: Secondary | ICD-10-CM | POA: Diagnosis not present

## 2024-05-02 DIAGNOSIS — I517 Cardiomegaly: Secondary | ICD-10-CM | POA: Diagnosis not present

## 2024-05-02 DIAGNOSIS — F4312 Post-traumatic stress disorder, chronic: Secondary | ICD-10-CM | POA: Diagnosis not present

## 2024-05-02 DIAGNOSIS — J029 Acute pharyngitis, unspecified: Secondary | ICD-10-CM | POA: Diagnosis not present

## 2024-05-02 DIAGNOSIS — F331 Major depressive disorder, recurrent, moderate: Secondary | ICD-10-CM | POA: Diagnosis not present

## 2024-05-02 LAB — LAB REPORT - SCANNED: EGFR: 97

## 2024-05-17 DIAGNOSIS — M2011 Hallux valgus (acquired), right foot: Secondary | ICD-10-CM | POA: Diagnosis not present

## 2024-05-17 DIAGNOSIS — S92524A Nondisplaced fracture of medial phalanx of right lesser toe(s), initial encounter for closed fracture: Secondary | ICD-10-CM | POA: Diagnosis not present

## 2024-05-21 ENCOUNTER — Ambulatory Visit: Admitting: Infectious Diseases

## 2024-05-22 ENCOUNTER — Encounter: Payer: Self-pay | Admitting: Internal Medicine

## 2024-05-22 ENCOUNTER — Ambulatory Visit (INDEPENDENT_AMBULATORY_CARE_PROVIDER_SITE_OTHER): Admitting: Internal Medicine

## 2024-05-22 ENCOUNTER — Other Ambulatory Visit: Payer: Self-pay

## 2024-05-22 VITALS — BP 116/77 | HR 65 | Temp 98.2°F | Wt 158.0 lb

## 2024-05-22 DIAGNOSIS — J040 Acute laryngitis: Secondary | ICD-10-CM | POA: Diagnosis not present

## 2024-05-22 DIAGNOSIS — R899 Unspecified abnormal finding in specimens from other organs, systems and tissues: Secondary | ICD-10-CM | POA: Diagnosis not present

## 2024-05-22 DIAGNOSIS — R5382 Chronic fatigue, unspecified: Secondary | ICD-10-CM

## 2024-05-22 DIAGNOSIS — K219 Gastro-esophageal reflux disease without esophagitis: Secondary | ICD-10-CM

## 2024-05-22 NOTE — Progress Notes (Signed)
 [facility]  Reason for Consult:multiple symptoms including fatigue Referring Provider: Bascom Necessary (np out of [facility])    There are no active problems to display for this patient.     HPI: Michaela Wiley is a 58 y.o. female with vulva, gerd, depression/anxiety, hx lyme disease, s/p cholecystectomy and oophorectomy bilateral for cyst), lichen sclerosis, being a mushroom forager (licensed), referred here for chronic fatigue in setting of positive ebv labs   I reviewed records from pcp: 05/02/24 ebv vca igm negative, igg positive; ebv nuclear ag positive Cbc 7/15/214 Cr 0.7; normal lft; albumin 4.9; calcium 10.2 Tsh normal Hep b sag, sab, cAb negative Hcv ab negative  She said she has had 1-2 years sleepy, fatigue; not progressive [place] She reports appetite stable and weight gain No f/c/nightsweat No hot flash   Also reports having to clear throat in morning and sorethroat in morning; cough to clear phlegm but no chronic cough. She reports sore throat that that's constant   She has had episodes of sharp chest pain/constant spasm at night and pressure. Worse with cleaning shed without mask or eating. Sometimes released by vomiting. She had recent echo per her report bilateral atrial enlargement pending further cardiology evaluation   Since lyme disease she reports sore joints diffusely; no monoarticular joint symptoms   No n/v/diarrhea, numbness, tingling No dysuria/urgency, but reports frequency   No other complaint    Review of Systems: ROS All other ros negative       Past Medical History:  Diagnosis Date   Atypical mole     Social History   Tobacco Use   Smoking status: Never   Smokeless tobacco: Never  Substance Use Topics   Alcohol use: Never    Family History  Problem Relation Age of Onset   Breast cancer Mother     Allergies  Allergen Reactions    Phenazopyridine     Other reaction(s): Other (See Comments) vomiting   Pseudoephedrine Hcl Hypertension    OBJECTIVE: Vitals:   05/22/24 1006  BP: 116/77  Pulse: 65  Temp: 98.2 F (36.8 C)  TempSrc: Oral  SpO2: 99%  Weight: 158 lb (71.7 kg)   Body mass index is 27.12 kg/m.   Physical Exam  Lab:  Microbiology:  Serology:  Imaging:   Assessment/plan: Problem List Items Addressed This Visit   None Visit Diagnoses       Chronic fatigue    -  Primary     Reflux laryngitis         Abnormal laboratory test             Patient with multiple complaint including chronic fatigue  We reviewed ebv serology which means just prior exposure We reviewed who is at risk for chronic ebv reactivation syndrome and that she is not by symptoms/labs  Tsh normal, labs normal, albumin normal  For her throat/clearing of phlegm, could be related to gerd or post nasal gtt and advise gi/ent follow up  With gerd no red flag but chronic gerd might need an egd to look at the esophagus for abnormal premalignant change   I did also discuss that diffuse arthralgia/fatigue and at times sorethroat/gerd could be part of connective tissue so if these are pervasive symptoms could consider rheumatology evaluation   For chest pain she is pending cardiology evaluation  Again I reassure her she doesn't not have any infectious disease  at this time     Follow-up: No follow-ups on file.  [name], MD [facility] 05/22/2024, 10:23 AM

## 2024-05-22 NOTE — Patient Instructions (Signed)
 Please see print out for what we discussed

## 2024-07-02 ENCOUNTER — Encounter: Payer: Self-pay | Admitting: Cardiovascular Disease

## 2024-07-02 ENCOUNTER — Ambulatory Visit: Attending: Cardiovascular Disease | Admitting: Cardiovascular Disease

## 2024-07-02 VITALS — BP 104/66 | HR 74 | Ht 64.0 in | Wt 161.0 lb

## 2024-07-02 DIAGNOSIS — E782 Mixed hyperlipidemia: Secondary | ICD-10-CM | POA: Diagnosis not present

## 2024-07-02 DIAGNOSIS — R931 Abnormal findings on diagnostic imaging of heart and coronary circulation: Secondary | ICD-10-CM | POA: Diagnosis not present

## 2024-07-02 DIAGNOSIS — R0789 Other chest pain: Secondary | ICD-10-CM | POA: Diagnosis not present

## 2024-07-02 DIAGNOSIS — E785 Hyperlipidemia, unspecified: Secondary | ICD-10-CM | POA: Insufficient documentation

## 2024-07-02 DIAGNOSIS — R9431 Abnormal electrocardiogram [ECG] [EKG]: Secondary | ICD-10-CM | POA: Diagnosis not present

## 2024-07-02 NOTE — Assessment & Plan Note (Signed)
 Coronary calcium score performed 02/02/2024 was 44, all in the left main.  She was experiencing some chest pain over the summer which sounds gastrointestinal.  She did see Dr. Wadie Counter, cardiologist at [facility], who suspected her pain was musculoskeletal.

## 2024-07-02 NOTE — Assessment & Plan Note (Signed)
 History of hyperlipidemia intolerant to atorvastatin but currently on rosuvastatin which she is tolerating.  For initial lipid profile in May revealed total cholesterol 292 with an LDL of 182.  Her follow-up lipid profile 04/30/2024 revealed her total cholesterol had fallen down to 136.  This is followed by her PCP.

## 2024-07-02 NOTE — Patient Instructions (Signed)
 Medication Instructions:  Your physician recommends that you continue on your current medications as directed. Please refer to the Current Medication list given to you today.  *If you need a refill on your cardiac medications before your next appointment, please call your pharmacy*  Follow-Up: At [facility], you and your health needs are our priority.  As part of our continuing mission to provide you with exceptional heart care, our providers are all part of one team.  This team includes your primary Cardiologist (physician) and Advanced Practice Providers or APPs (Physician Assistants and Nurse Practitioners) who all work together to provide you with the care you need, when you need it.  Your next appointment:    As needed  Provider:   Dorn Lesches, MD

## 2024-07-02 NOTE — Progress Notes (Signed)
 07/02/2024 Michaela Wiley   Oct 17, 1965  [id number]  Primary Physician Alben Therisa KANDICE, PA Primary Cardiologist: Dorn JINNY Lesches MD GENI CODY MADEIRA, FSCAI  HPI:  Michaela OWCZARZAK is a 58 y.o. 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  She also has a mushroom forager.  She is being sent for second opinion because of atypical chest pain which she experiences past summer but has since resolved as well as a mildly elevated coronary calcium score.  Her risk factors include hyperlipidemia currently tolerating rosuvastatin with marked improvement in her lipid profile.  Her father did have WPW but there is no first-degree relative with ischemic heart disease.  She is never had a heart attack or stroke.  She is fairly active as a mushroom forager  as well as walking her dog and is asymptomatic during these activities.  She did have a coronary calcium score performed 02/02/2024 which was 44 on the left main.  Since her episodes of chest pain this past summer which sounds like reflux, her symptoms have completely resolved.   Current Meds  Medication Sig   estradiol (ESTRACE) 0.1 MG/GM vaginal cream 0.5g Vaginal twice a week; Duration: 30 days   Magnesium 300 MG CAPS    Omega-3 Fatty Acids (RA FISH OIL) 1000 MG CAPS Take by mouth.   rosuvastatin (CRESTOR) 20 MG tablet Take 20 mg by mouth at bedtime.   St Johns Wort 1000 MG CAPS 2 tablets   tacrolimus  (PROTOPIC ) 0.1 % ointment Apply topically at bedtime.   Ubiquinol 200 MG CAPS      Allergies  Allergen Reactions   Phenazopyridine     Other reaction(s): Other (See Comments) vomiting   Pseudoephedrine Hcl Hypertension    Social History   Socioeconomic History   Marital status: Married    Spouse name: Not on file   Number of children: Not on file   Years of education: Not on file   Highest education level: Not on  file  Occupational History   Not on file  Tobacco Use   Smoking status: Never   Smokeless tobacco: Never  Substance and Sexual Activity   Alcohol use: Never   Drug use: Not on file   Sexual activity: Not on file  Other Topics Concern   Not on file  Social History Narrative   Not on file   Social Drivers of Health   Financial Resource Strain: Not on file  Food Insecurity: Not on file  Transportation Needs: Not on file  Physical Activity: Not on file  Stress: Not on file  Social Connections: Not on file  Intimate Partner Violence: Not on file     Review of Systems: General: negative for chills, fever, night sweats or weight changes.  Cardiovascular: negative for chest pain, dyspnea on exertion, edema, orthopnea, palpitations, paroxysmal nocturnal dyspnea or shortness of breath Dermatological: negative for rash Respiratory: negative for cough or wheezing Urologic: negative for hematuria Abdominal: negative for nausea, vomiting, diarrhea, bright red blood per rectum, melena, or hematemesis Neurologic: negative for visual changes, syncope, or dizziness All other systems reviewed and are otherwise negative except as noted above.    Blood pressure 104/66, pulse 74, height 5' 4 (1.626 m), weight 161 lb (73 kg), SpO2 99%.  General appearance: alert and no distress Neck: no adenopathy, no carotid bruit, no JVD, supple, symmetrical, trachea midline, and thyroid not  enlarged, symmetric, no tenderness/mass/nodules Lungs: clear to auscultation bilaterally Heart: regular rate and rhythm, S1, S2 normal, no murmur, click, rub or gallop Extremities: extremities normal, atraumatic, no cyanosis or edema Pulses: 2+ and symmetric Skin: Skin color, texture, turgor normal. No rashes or lesions Neurologic: Grossly normal  EKG EKG Interpretation Date/Time:  Monday July 02 2024 14:40:10 EST Ventricular Rate:  74 PR Interval:  138 QRS Duration:  86 QT Interval:  378 QTC  Calculation: 419 R Axis:   72  Text Interpretation: Normal sinus rhythm Normal ECG When compared with ECG of 19-Apr-2024 14:32, PREVIOUS ECG IS PRESENT Confirmed by Court Carrier [phone]) on 07/02/2024 2:41:07 PM    ASSESSMENT AND PLAN:   Hyperlipidemia History of hyperlipidemia intolerant to atorvastatin but currently on rosuvastatin which she is tolerating.  For initial lipid profile in May revealed total cholesterol 292 with an LDL of 182.  Her follow-up lipid profile 04/30/2024 revealed her total cholesterol had fallen down to 136.  This is followed by her PCP.  Elevated coronary artery calcium score Coronary calcium score performed 02/02/2024 was 44, all in the left main.  She was experiencing some chest pain over the summer which sounds gastrointestinal.  She did see Dr. Wadie Counter, cardiologist at [facility], who suspected her pain was musculoskeletal.  Atypical chest pain Pain occurred mostly at night and resolved with antacids.  She is currently asymptomatic.     Carrier DOROTHA Court MD FACP,FACC,FAHA, [facility] 07/02/2024 3:01 PM

## 2024-07-02 NOTE — Assessment & Plan Note (Signed)
 Pain occurred mostly at night and resolved with antacids.  She is currently asymptomatic.

## 2024-07-11 ENCOUNTER — Ambulatory Visit: Payer: Self-pay | Admitting: Cardiovascular Disease
